# Patient Record
Sex: Female | Born: 2005 | Race: Black or African American | Hispanic: No | Marital: Single | Smoking: Never smoker
Health system: Southern US, Community
[De-identification: ages and names within clinical notes are randomized; demographics above are authoritative.]

## PROBLEM LIST (undated history)

## (undated) DIAGNOSIS — J45909 Unspecified asthma, uncomplicated: Secondary | ICD-10-CM

## (undated) DIAGNOSIS — L309 Dermatitis, unspecified: Secondary | ICD-10-CM

## (undated) HISTORY — DX: Dermatitis, unspecified: L30.9

## (undated) HISTORY — PX: NO PAST SURGERIES: SHX2092

## (undated) HISTORY — DX: Unspecified asthma, uncomplicated: J45.909

---
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
## 2023-03-10 ENCOUNTER — Ambulatory Visit
Admission: EM | Admit: 2023-03-10 | Discharge: 2023-03-10 | Disposition: A | Discharge status: Home / Self Care | Payer: Medicaid Other

## 2023-03-10 ENCOUNTER — Other Ambulatory Visit: Payer: Self-pay

## 2023-03-10 DIAGNOSIS — L0291 Cutaneous abscess, unspecified: Secondary | ICD-10-CM | POA: Diagnosis not present

## 2023-03-10 MED ORDER — DOXYCYCLINE HYCLATE 100 MG PO CAPS: 100.0000 mg | ORAL_CAPSULE | Freq: Two times a day (BID) | ORAL | 0 refills | Status: DC

## 2023-03-10 NOTE — ED Provider Notes (Signed)
[facility]    CSN: [id number] Arrival date & time: 03/10/23  1455      History   Chief Complaint Chief Complaint  Patient presents with   Insect Bite    HPI Latoya Stevens is a 17 y.o. female.   Patient here today for evaluation of possible insect bite to her left lower leg.  She states 2 days ago she started noticed a small area of redness and swelling but this is worsened with time.  She has not any fever.  She denies any nausea or vomiting.  She has not any shortness of breath or trouble swallowing.  The history is provided by the patient.    Past Medical History:  Diagnosis Date   Asthma     Patient Active Problem List   Diagnosis Date Noted   Chlamydia contact, treated 08/31/2021   Gonorrhea affecting pregnancy 08/31/2021   Stye 07/27/2021   Eczema 06/01/2021   Alpha thalassemia silent carrier 05/13/2021   Rh negative state in antepartum period 05/02/2021   Asthma 04/21/2021   Supervision of normal first teen pregnancy 04/14/2021    Past Surgical History:  Procedure Laterality Date   NO PAST SURGERIES      OB History     Gravida  1   Para  1   Term      Preterm  1   AB      Living  1      SAB      IAB      Ectopic      Multiple  0   Live Births  1            Home Medications    Prior to Admission medications   Medication Sig Start Date End Date Taking? Authorizing Provider  doxycycline (VIBRAMYCIN) 100 MG capsule Take 1 capsule (100 mg total) by mouth 2 (two) times daily. 03/10/23  Yes Tomi Bamberger, PA-C  GENABIO COVID-19 RAPID TEST KIT TEST AS DIRECTED TODAY 02/20/23  Yes [provider]  triamcinolone ointment (KENALOG) 0.1 % Apply 1 Application topically 2 (two) times daily. 02/20/23  Yes [provider]  acetaminophen (TYLENOL) 325 MG tablet Take 2 tablets (650 mg total) by mouth every 4 (four) hours as needed (for pain scale < 4). 10/04/21   Warner Mccreedy, MD  clindamycin (CLEOCIN) 100 MG  vaginal suppository Place 1 suppository (100 mg total) vaginally at bedtime. 12/28/21   Adam Phenix, MD  Prenatal Vit-Fe Fumarate-FA (MULTIVITAMIN-PRENATAL) 27-0.8 MG TABS tablet Take 1 tablet by mouth daily at 12 noon.    [provider]  VENTOLIN HFA 108 (90 Base) MCG/ACT inhaler INHALE 2 PUFFS INTO THE LUNGS EVERY 4 HOURS AS NEEDED FOR WHEEZING OR SHORTNESS OF BREATH 08/25/22   Gerrit Heck, CNM    Family History Family History  Problem Relation Age of Onset   Hypertension Mother     Social History Social History   Tobacco Use   Smoking status: Never    Passive exposure: Never   Smokeless tobacco: Never  Vaping Use   Vaping status: Never Used  Substance Use Topics   Alcohol use: No   Drug use: Not Currently    Types: Marijuana     Allergies   Egg-derived products, Fish allergy, Peanuts [peanut oil], and Tomato   Review of Systems Review of Systems  Constitutional:  Negative for chills and fever.  Eyes:  Negative for discharge and redness.  Respiratory:  Negative for shortness  of breath.   Gastrointestinal:  Negative for diarrhea, nausea and vomiting.  Skin:  Positive for color change. Negative for wound.     Physical Exam Triage Vital Signs ED Triage Vitals  Encounter Vitals Group     BP 03/10/23 1509 105/65     Systolic BP Percentile --      Diastolic BP Percentile --      Pulse Rate 03/10/23 1509 92     Resp 03/10/23 1509 16     Temp 03/10/23 1509 98.8 F (37.1 C)     Temp Source 03/10/23 1509 Oral     SpO2 03/10/23 1509 98 %     Weight 03/10/23 1508 100 lb (45.4 kg)     Height 03/10/23 1508 4\' 11"  (1.499 m)     Head Circumference --      Peak Flow --      Pain Score 03/10/23 1506 5     Pain Loc --      Pain Education --      Exclude from Growth Chart --    No data found.  Updated Vital Signs BP 105/65 (BP Location: Right Arm)   Pulse 92   Temp 98.8 F (37.1 C) (Oral)   Resp 16   Ht 4\' 11"  (1.499 m)   Wt 100 lb (45.4 kg)   LMP  02/01/2023 (Exact Date)   SpO2 98%   BMI 20.20 kg/m   Physical Exam Vitals and nursing note reviewed.  Constitutional:      General: She is not in acute distress.    Appearance: Normal appearance. She is not ill-appearing.  HENT:     Head: Normocephalic and atraumatic.     Nose: Nose normal. No congestion or rhinorrhea.  Eyes:     Conjunctiva/sclera: Conjunctivae normal.  Cardiovascular:     Rate and Rhythm: Normal rate.  Pulmonary:     Effort: Pulmonary effort is normal. No respiratory distress.  Skin:    Comments: Approximately 2 cm area of induration with erythema noted to left lower leg without active bleeding or drainage  Neurological:     Mental Status: She is alert.  Psychiatric:        Mood and Affect: Mood normal.        Behavior: Behavior normal.      UC Treatments / Results  Labs (all labs ordered are listed, but only abnormal results are displayed) Labs Reviewed - No data to display  EKG   Radiology No results found.  Procedures Procedures (including critical care time)  Medications Ordered in UC Medications - No data to display  Initial Impression / Assessment and Plan / UC Course  I have reviewed the triage vital signs and the nursing notes.  Pertinent labs & imaging results that were available during my care of the patient were reviewed by me and considered in my medical decision making (see chart for details).    Will treat to cover abscess with doxycycline.  Recommended follow-up if no gradual improvement with any further concerns.  Final Clinical Impressions(s) / UC Diagnoses   Final diagnoses:  Abscess   Discharge Instructions   None    ED Prescriptions     Medication Sig Dispense Auth. Provider   doxycycline (VIBRAMYCIN) 100 MG capsule Take 1 capsule (100 mg total) by mouth 2 (two) times daily. 20 capsule Tomi Bamberger, PA-C      PDMP not reviewed this encounter.   Tomi Bamberger, PA-C 03/10/23 1535

## 2023-03-10 NOTE — ED Triage Notes (Signed)
"  I have a bite (maybe spider) on my left lower leg, outside of it". Redness, swelling. No fever. First noticed "about 2 days ago".

## 2023-05-02 ENCOUNTER — Ambulatory Visit (HOSPITAL_BASED_OUTPATIENT_CLINIC_OR_DEPARTMENT_OTHER): Payer: Medicaid Other | Admitting: Family Medicine

## 2023-05-24 ENCOUNTER — Encounter (HOSPITAL_BASED_OUTPATIENT_CLINIC_OR_DEPARTMENT_OTHER): Payer: Self-pay | Admitting: Family Medicine

## 2023-05-24 ENCOUNTER — Ambulatory Visit (INDEPENDENT_AMBULATORY_CARE_PROVIDER_SITE_OTHER): Payer: Medicaid Other | Admitting: Family Medicine

## 2023-05-24 VITALS — BP 92/61 | HR 85 | Ht 59.0 in | Wt 103.0 lb

## 2023-05-24 DIAGNOSIS — Z7689 Persons encountering health services in other specified circumstances: Secondary | ICD-10-CM | POA: Diagnosis not present

## 2023-05-24 DIAGNOSIS — J4530 Mild persistent asthma, uncomplicated: Secondary | ICD-10-CM | POA: Diagnosis not present

## 2023-05-24 DIAGNOSIS — Z202 Contact with and (suspected) exposure to infections with a predominantly sexual mode of transmission: Secondary | ICD-10-CM | POA: Diagnosis not present

## 2023-05-24 DIAGNOSIS — L308 Other specified dermatitis: Secondary | ICD-10-CM

## 2023-05-24 LAB — OB RESULTS CONSOLE GC/CHLAMYDIA: Chlamydia: POSITIVE

## 2023-05-24 MED ORDER — VENTOLIN HFA 108 (90 BASE) MCG/ACT IN AERS: 2.0000 | INHALATION_SPRAY | RESPIRATORY_TRACT | 6 refills | Status: DC | PRN

## 2023-05-24 MED ORDER — MONTELUKAST SODIUM 10 MG PO TABS: 10.0000 mg | ORAL_TABLET | Freq: Every day | ORAL | 3 refills | Status: DC

## 2023-05-24 MED ORDER — TRIAMCINOLONE ACETONIDE 0.1 % EX OINT: 1.0000 | TOPICAL_OINTMENT | Freq: Two times a day (BID) | CUTANEOUS | 5 refills | Status: DC

## 2023-05-24 NOTE — Progress Notes (Signed)
New Patient Office Visit  Subjective:   Latoya Stevens 08-Jan-2006 05/24/2023  Chief Complaint  Patient presents with   New Patient (Initial Visit)    Patient is here to get established with the practice. Has concerns about eczema, asthma, and sinus problems. Has been having problems with her sinuses for the past 2 years.     HPI: Latoya Stevens presents today to establish care at Primary Care and Sports Medicine at [facility]. Introduced to Publishing rights manager role and practice setting.  All questions answered.   Last PCP: OBGYN for management of chronic conditions  Last annual physical: Unknown Concerns: See below   ECZEMA:  Patient reports hx of eczema since childhood that worsens in warm and dry weather. She does use kenalog cream 0.1% and had been on anti-itch "oral medication in the past for relief. She states kenalog cream does keep flaking and flare ups controlled.   ASTHMA: [name] presents for the medical management of asthma.  Medication regimen: Albuterol Inhaler as needed Well controlled: Not currently   Denies worsening SHOB, cough, wheezing, fevers or chills  The patient has been previously diagnosed with asthma. Symptoms have previously included chest tightness, dyspnea, productive cough, and wheezing. Associated symptoms include headache  , nasal congestion, productive cough, rhinorrhea  , and sneezing.  Patient has attempted to use antihistamine such as Zyrtec or Claritin without relief suspected precipitants include .  Symptoms have been gradually worsening since their onset. Observed precipitants include cold air, exercise, pollens, and upper respiratory infection.  Current limitations in activity from asthma include  limited length of exercise .  Number of days of school or work missed in the last month: 3. The previous exacerbation occurred  last week    ago. The patient reports adherence to this regimen  STD Testing:  Patient requesting  STD testing for gonorrhea and chlamydia only today.  Reports unprotected recent sexual activity.  Patient currently is using Nexplanon for contraceptive.  She is asymptomatic and denies discharge, dysuria, lesions or rashes, or abdominal pain.  Denies known exposure to STD.    Patient's last menstrual period was 04/22/2023 (approximate).   The following portions of the patient's history were reviewed and updated as appropriate: past medical history, past surgical history, family history, social history, allergies, medications, and problem list.   Patient Active Problem List   Diagnosis Date Noted   Chlamydia contact, treated 08/31/2021   Gonorrhea affecting pregnancy 08/31/2021   Stye 07/27/2021   Eczema 06/01/2021   Alpha thalassemia silent carrier 05/13/2021   Rh negative state in antepartum period 05/02/2021   Asthma 04/21/2021   Supervision of normal first teen pregnancy 04/14/2021   Past Medical History:  Diagnosis Date   Asthma    Eczema    Past Surgical History:  Procedure Laterality Date   NO PAST SURGERIES     Family History  Problem Relation Age of Onset   Hypertension Mother    Social History   Socioeconomic History   Marital status: Single    Spouse name: Not on file   Number of children: Not on file   Years of education: Not on file   Highest education level: Not on file  Occupational History   Not on file  Tobacco Use   Smoking status: Never    Passive exposure: Never   Smokeless tobacco: Never  Vaping Use   Vaping status: Never Used  Substance and Sexual Activity   Alcohol use: No   Drug  use: Not Currently    Types: Marijuana   Sexual activity: Yes    Partners: Male    Birth control/protection: Implant    Comment: Nexplanon April 2023  Other Topics Concern   Not on file  Social History Narrative   Not on file   Social Drivers of Health   Financial Resource Strain: Low Risk  (05/24/2023)   Overall Financial Resource Strain (CARDIA)     Difficulty of Paying Living Expenses: Not hard at all  Food Insecurity: No Food Insecurity (05/24/2023)   Hunger Vital Sign    Worried About Running Out of Food in the Last Year: Never true    Ran Out of Food in the Last Year: Never true  Transportation Needs: No Transportation Needs (05/24/2023)   PRAPARE - Administrator, Civil Service (Medical): No    Lack of Transportation (Non-Medical): No  Physical Activity: Insufficiently Active (05/24/2023)   Exercise Vital Sign    Days of Exercise per Week: 3 days    Minutes of Exercise per Session: 20 min  Stress: No Stress Concern Present (05/24/2023)   [employer] of Occupational Health - Occupational Stress Questionnaire    Feeling of Stress : Not at all  Social Connections: Moderately Isolated (05/24/2023)   Social Connection and Isolation Panel [NHANES]    Frequency of Communication with Friends and Family: More than three times a week    Frequency of Social Gatherings with Friends and Family: Twice a week    Attends Religious Services: More than 4 times per year    Active Member of [employer] or Organizations: No    Attends Banker Meetings: Never    Marital Status: Never married  Intimate Partner Violence: Not At Risk (05/24/2023)   Humiliation, Afraid, Rape, and Kick questionnaire    Fear of Current or Ex-Partner: No    Emotionally Abused: No    Physically Abused: No    Sexually Abused: No   Outpatient Medications Prior to Visit  Medication Sig Dispense Refill   Etonogestrel (NEXPLANON [place]) Inject into the skin.     acetaminophen (TYLENOL) 325 MG tablet Take 2 tablets (650 mg total) by mouth every 4 (four) hours as needed (for pain scale < 4). 60 tablet 0   VENTOLIN HFA 108 (90 Base) MCG/ACT inhaler INHALE 2 PUFFS INTO THE LUNGS EVERY 4 HOURS AS NEEDED FOR WHEEZING OR SHORTNESS OF BREATH 18 g 2   clindamycin (CLEOCIN) 100 MG vaginal suppository Place 1 suppository (100 mg total) vaginally at bedtime. 3  suppository 0   doxycycline (VIBRAMYCIN) 100 MG capsule Take 1 capsule (100 mg total) by mouth 2 (two) times daily. 20 capsule 0   GENABIO COVID-19 RAPID TEST KIT TEST AS DIRECTED TODAY     Prenatal Vit-Fe Fumarate-FA (MULTIVITAMIN-PRENATAL) 27-0.8 MG TABS tablet Take 1 tablet by mouth daily at 12 noon.     triamcinolone ointment (KENALOG) 0.1 % Apply 1 Application topically 2 (two) times daily. (Patient not taking: Reported on 05/24/2023)     No facility-administered medications prior to visit.   Allergies  Allergen Reactions   Egg-Derived Products     Pt states she is not allergice to eggs or egg derived products   Fish Allergy    Peanuts [Peanut Oil]     ROS: A complete ROS was performed with pertinent positives/negatives noted in the HPI. The remainder of the ROS are negative.   Objective:   Today's Vitals   05/24/23 1545  BP: 92/61  Pulse: 85  SpO2: 100%  Weight: 103 lb (46.7 kg)  Height: 4\' 11"  (1.499 m)    GENERAL: Well-appearing, in NAD. Well nourished.  SKIN: Pink, warm and dry.  Macular, erythematous like rash with flaking present to bilateral upper extremities.  No drainage, bleeding or plaque-like formation Head: Normocephalic. NECK: Trachea midline. Full ROM w/o pain or tenderness. No lymphadenopathy.  EARS: Tympanic membranes are intact, translucent without bulging and without drainage. Appropriate landmarks visualized.  EYES: Conjunctiva clear without exudates. EOMI, PERRL, no drainage present.  NOSE: Septum midline w/o deformity. Nares patent, mucosa pink and non-inflamed w/o drainage. No sinus tenderness.  THROAT: Uvula midline. Oropharynx clear.  Mucous membranes pink and moist.  RESPIRATORY: Chest wall symmetrical. Respirations even and non-labored. Breath sounds clear to auscultation bilaterally.  Cough is congested, nonproductive CARDIAC: S1, S2 present, regular rate and rhythm without murmur or gallops. Peripheral pulses 2+ bilaterally.  MSK: Muscle tone  and strength appropriate for age.  EXTREMITIES: Without clubbing, cyanosis, or edema.  NEUROLOGIC: No motor or sensory deficits. Steady, even gait. C2-C12 intact.  PSYCH/MENTAL STATUS: Alert, oriented x 3. Cooperative, appropriate mood and affect.      Assessment & Plan:  1. Encounter to establish care with new doctor (Primary) Discussed role of PCP with patient and need for well-child check.  Patient will return in the next 4 to 6 weeks to complete.  2. Encounter for assessment of sexually transmitted disease exposure Patient is asymptomatic.  Offered screening for STI including RPR, trichomonas, hepatitis C, HIV and patient declined.  Urine obtained and sent to [facility] for testing of gonorrhea and chlamydia.  Patient will be notified of results when available. - Ct, Ng, Mycoplasmas NAA, Urine  3. Mild persistent asthma without complication Patient to continue albuterol inhaler as needed.  Patient to start Singulair 10 mg nightly at bedtime for mild persistent asthma.  Discussed avoidance of triggers and management.  Will follow-up in approximately 6 weeks for asthma evaluation. - montelukast (SINGULAIR) 10 MG tablet; Take 1 tablet (10 mg total) by mouth at bedtime.  Dispense: 30 tablet; Refill: 3  4. Other eczema Kenalog cream working well for her and refilled.  Also recommended OTC topical therapies to assist with dryness and use of Vaseline or Aquaphor as well.   Patient to reach out to office if new, worrisome, or unresolved symptoms arise or if no improvement in patient's condition. Patient verbalized understanding and is agreeable to treatment plan. All questions answered to patient's satisfaction.    Return in about 2 months (around 07/25/2023) for [facility] and Asthma Follow up .    Hilbert Bible, [place]

## 2023-05-27 ENCOUNTER — Encounter (HOSPITAL_BASED_OUTPATIENT_CLINIC_OR_DEPARTMENT_OTHER): Payer: Self-pay | Admitting: Family Medicine

## 2023-05-27 LAB — CT, NG, MYCOPLASMAS NAA, URINE
Chlamydia trachomatis, NAA: POSITIVE — AB
Mycoplasma genitalium NAA: POSITIVE — AB
Mycoplasma hominis NAA: POSITIVE — AB
Neisseria gonorrhoeae, NAA: NEGATIVE
Ureaplasma spp NAA: POSITIVE — AB

## 2023-05-28 ENCOUNTER — Encounter (HOSPITAL_BASED_OUTPATIENT_CLINIC_OR_DEPARTMENT_OTHER): Payer: Self-pay | Admitting: Family Medicine

## 2023-05-28 MED ORDER — DOXYCYCLINE HYCLATE 100 MG PO TABS: 100.0000 mg | ORAL_TABLET | Freq: Two times a day (BID) | ORAL | 0 refills | Status: DC

## 2023-05-28 MED ORDER — MOXIFLOXACIN HCL 400 MG PO TABS: 400.0000 mg | ORAL_TABLET | Freq: Every day | ORAL | 0 refills | Status: AC

## 2023-05-28 NOTE — Addendum Note (Signed)
Addended byJerre Simon on: 05/28/2023 01:25 PM   Modules accepted: Orders

## 2023-07-25 ENCOUNTER — Ambulatory Visit (HOSPITAL_BASED_OUTPATIENT_CLINIC_OR_DEPARTMENT_OTHER): Payer: Medicaid Other | Admitting: Family Medicine

## 2023-07-25 ENCOUNTER — Encounter (HOSPITAL_BASED_OUTPATIENT_CLINIC_OR_DEPARTMENT_OTHER): Payer: Self-pay | Admitting: Family Medicine

## 2023-07-25 ENCOUNTER — Other Ambulatory Visit (HOSPITAL_COMMUNITY)
Admission: RE | Admit: 2023-07-25 | Discharge: 2023-07-25 | Disposition: A | Discharge status: Home / Self Care | Payer: Medicaid Other | Source: Ambulatory Visit | Attending: Family Medicine | Admitting: Family Medicine

## 2023-07-25 VITALS — BP 109/65 | HR 83 | Ht 59.0 in | Wt 102.8 lb

## 2023-07-25 DIAGNOSIS — Z0000 Encounter for general adult medical examination without abnormal findings: Secondary | ICD-10-CM | POA: Diagnosis not present

## 2023-07-25 DIAGNOSIS — Z13220 Encounter for screening for lipoid disorders: Secondary | ICD-10-CM

## 2023-07-25 DIAGNOSIS — J4530 Mild persistent asthma, uncomplicated: Secondary | ICD-10-CM | POA: Diagnosis not present

## 2023-07-25 DIAGNOSIS — Z1159 Encounter for screening for other viral diseases: Secondary | ICD-10-CM

## 2023-07-25 DIAGNOSIS — Z114 Encounter for screening for human immunodeficiency virus [HIV]: Secondary | ICD-10-CM

## 2023-07-25 DIAGNOSIS — Z23 Encounter for immunization: Secondary | ICD-10-CM

## 2023-07-25 DIAGNOSIS — Z202 Contact with and (suspected) exposure to infections with a predominantly sexual mode of transmission: Secondary | ICD-10-CM | POA: Diagnosis present

## 2023-07-25 DIAGNOSIS — A749 Chlamydial infection, unspecified: Secondary | ICD-10-CM | POA: Diagnosis not present

## 2023-07-25 MED ORDER — MONTELUKAST SODIUM 10 MG PO TABS: 10.0000 mg | ORAL_TABLET | Freq: Every day | ORAL | 3 refills | Status: AC

## 2023-07-25 NOTE — Patient Instructions (Signed)
Marland Kitchen

## 2023-07-25 NOTE — Progress Notes (Signed)
Subjective:   Latoya Stevens [birth year]  07/25/2023   CC: Chief Complaint  Patient presents with   Well Child    Patient is here today for her well child checkup. Patient is wanting to do a urinalysis for STD check.    HPI: Latoya Stevens is a 18 y.o. female who presents for a routine health maintenance exam.  Labs collected at time of visit.   ASTHMA:  ASTHMA: Latoya Stevens presents for the follow up of medical management of asthma with new medication changes.  Medication regimen: Albuterol inhaler PRN, Singulair 10mg  nightly (new started in Dec 2024 Well controlled: Yes currently with added Singulair   Denies worsening SHOB, cough, wheezing  HEALTH SCREENINGS: - Vision Screening: not applicable - Dental Visits: up to date - Pap smear: not applicable - Breast Exam: not applicable - STD Screening: Ordered today- asymptomatic  - Mammogram (40+): Not applicable  - Colonoscopy (45+): Not applicable  - Bone Density (65+ or under 65 with predisposing conditions): Not applicable  - Lung CA screening with low-dose CT:  Not applicable Adults age 68-80 who are current cigarette smokers or quit within the last 15 years. Must have 20 pack year history.   Depression and Anxiety Screen done today and results listed below:     07/25/2023    3:28 PM 05/24/2023    3:53 PM 08/18/2021   10:37 AM 04/14/2021    2:05 PM  Depression screen PHQ 2/9  Decreased Interest 0 0 2 0  Down, Depressed, Hopeless 0 0 0 0  PHQ - 2 Score 0 0 2 0  Altered sleeping 0 0 2 0  Tired, decreased energy 0 0 2 1  Change in appetite 0 0 0 0  Feeling bad or failure about yourself  0 0 0 0  Trouble concentrating 0 0 0 0  Moving slowly or fidgety/restless 0 0 0 0  Suicidal thoughts 0 0 0 0  PHQ-9 Score 0 0 6 1  Difficult doing work/chores Not difficult at all Not difficult at all        07/25/2023    3:28 PM 05/24/2023    3:53 PM 08/18/2021   10:37 AM 04/14/2021    2:06 PM  GAD 7 : Generalized Anxiety Score   Nervous, Anxious, on Edge 0 0 0 0  Control/stop worrying 0 0 0 0  Worry too much - different things 0 0 0 0  Trouble relaxing 0 0 0 0  Restless 0 0 0 0  Easily annoyed or irritable 0 0 2 0  Afraid - awful might happen 0 0 0 0  Total GAD 7 Score 0 0 2 0  Anxiety Difficulty Not difficult at all Not difficult at all      IMMUNIZATIONS: - Tdap: Tetanus vaccination status reviewed: last tetanus booster within 10 years. - HPV:  Will receive 2nd vaccine today - Influenza: Refused - Pneumovax: Not applicable - Prevnar 20: Not applicable - Shingrix (50+): Not applicable   Past medical history, surgical history, medications, allergies, family history and social history reviewed with patient today and changes made to appropriate areas of the chart.   Past Medical History:  Diagnosis Date   Asthma    Chlamydia contact, treated 08/31/2021   Eczema    Gonorrhea affecting pregnancy 08/31/2021   Rh negative state in antepartum period 05/02/2021   Rhogam at 28-30 weeks     Stye 07/27/2021   Supervision of normal first teen pregnancy 04/14/2021  Nursing Staff    Provider      Office Location    [facility]    Dating     6 wk [place]      Language     English    Anatomy US     Normal with echogenic focus- f/u scheduled      Flu Vaccine     04/21/21    Genetic/Carrier Screen     NIPS:  LR  AFP:     [place]: silent carrier alpha thal, neg 3/4      TDaP Vaccine      declined 08/18/2021    Hgb A1C or   GTT    Early N/A  Third trimester 60,     Past Surgical History:  Procedure Laterality Date   NO PAST SURGERIES      Current Outpatient Medications on File Prior to Visit  Medication Sig   Etonogestrel (NEXPLANON [place]) Inject into the skin.   triamcinolone ointment (KENALOG) 0.1 % Apply 1 Application topically 2 (two) times daily.   VENTOLIN HFA 108 (90 Base) MCG/ACT inhaler Inhale 2 puffs into the lungs every 4 (four) hours as needed for wheezing or shortness of breath.   No current  facility-administered medications on file prior to visit.    Allergies  Allergen Reactions   Fish Allergy    Peanuts [Peanut Oil]      Social History   Socioeconomic History   Marital status: Single    Spouse name: Not on file   Number of children: Not on file   Years of education: Not on file   Highest education level: Not on file  Occupational History   Not on file  Tobacco Use   Smoking status: Never    Passive exposure: Never   Smokeless tobacco: Never  Vaping Use   Vaping status: Never Used  Substance and Sexual Activity   Alcohol use: No   Drug use: Not Currently    Types: Marijuana   Sexual activity: Yes    Partners: Male    Birth control/protection: Implant    Comment: Nexplanon April 2023  Other Topics Concern   Not on file  Social History Narrative   Not on file   Social Drivers of Health   Financial Resource Strain: Low Risk  (05/24/2023)   Overall Financial Resource Strain (CARDIA)    Difficulty of Paying Living Expenses: Not hard at all  Food Insecurity: No Food Insecurity (05/24/2023)   Hunger Vital Sign    Worried About Running Out of Food in the Last Year: Never true    Ran Out of Food in the Last Year: Never true  Transportation Needs: No Transportation Needs (05/24/2023)   PRAPARE - Administrator, Civil Service (Medical): No    Lack of Transportation (Non-Medical): No  Physical Activity: Insufficiently Active (05/24/2023)   Exercise Vital Sign    Days of Exercise per Week: 3 days    Minutes of Exercise per Session: 20 min  Stress: No Stress Concern Present (05/24/2023)   [employer] of Occupational Health - Occupational Stress Questionnaire    Feeling of Stress : Not at all  Social Connections: Moderately Isolated (05/24/2023)   Social Connection and Isolation Panel [NHANES]    Frequency of Communication with Friends and Family: More than three times a week    Frequency of Social Gatherings with Friends and Family:  Twice a week    Attends Religious Services: More than 4 times per year  Active Member of Clubs or Organizations: No    Attends Banker Meetings: Never    Marital Status: Never married  Intimate Partner Violence: Not At Risk (05/24/2023)   Humiliation, Afraid, Rape, and Kick questionnaire    Fear of Current or Ex-Partner: No    Emotionally Abused: No    Physically Abused: No    Sexually Abused: No   Social History   Tobacco Use  Smoking Status Never   Passive exposure: Never  Smokeless Tobacco Never   Social History   Substance and Sexual Activity  Alcohol Use No    Family History  Problem Relation Age of Onset   Hypertension Mother      ROS: Denies fever, fatigue, unexplained weight loss/gain, chest pain, SHOB, and palpitations. Denies neurological deficits, gastrointestinal or genitourinary complaints, and skin changes.   Objective:   Today's Vitals   07/25/23 1524  BP: 109/65  Pulse: 83  SpO2: 100%  Weight: 102 lb 12.8 oz (46.6 kg)  Height: 4\' 11"  (1.499 m)    GENERAL APPEARANCE: Well-appearing, in NAD. Well nourished.  SKIN: Pink, warm and dry. Turgor normal. No rash, lesion, ulceration, or ecchymoses. Hair evenly distributed.  HEENT: HEAD: Normocephalic.  EYES: PERRLA. EOMI. Lids intact w/o defect. Sclera white, Conjunctiva pink w/o exudate.  EARS: External ear w/o redness, swelling, masses or lesions. EAC clear. TM's intact, translucent w/o bulging, appropriate landmarks visualized. Appropriate acuity to conversational tones.  NOSE: Septum midline w/o deformity. Nares patent, mucosa pink and non-inflamed w/o drainage. No sinus tenderness.  THROAT: Uvula midline. Oropharynx clear. Tonsils non-inflamed w/o exudate. Oral mucosa pink and moist.  NECK: Supple, Trachea midline. Full ROM w/o pain or tenderness. No lymphadenopathy. Thyroid non-tender w/o enlargement or palpable masses.  RESPIRATORY: Chest wall symmetrical w/o masses. Respirations even  and non-labored. Breath sounds clear to auscultation bilaterally. No wheezes, rales, rhonchi, or crackles. CARDIAC: S1, S2 present, regular rate and rhythm. No gallops, murmurs, rubs, or clicks. PMI w/o lifts, heaves, or thrills. No carotid bruits. Capillary refill <2 seconds. Peripheral pulses 2+ bilaterally. GI: Abdomen soft w/o distention. Normoactive bowel sounds. No palpable masses or tenderness. No guarding or rebound tenderness. Liver and spleen w/o tenderness or enlargement. No CVA tenderness.  MSK: Muscle tone and strength appropriate for age, w/o atrophy or abnormal movement.  EXTREMITIES: Active ROM intact, w/o tenderness, crepitus, or contracture. No obvious joint deformities or effusions. No clubbing, edema, or cyanosis.  NEUROLOGIC: CN's II-XII intact. Motor strength symmetrical with no obvious weakness. No sensory deficits. DTR's 2+ symmetric bilaterally. Steady, even gait.  PSYCH/MENTAL STATUS: Alert, oriented x 3. Cooperative, appropriate mood and affect.    Assessment & Plan:  1. Annual physical exam (Primary) Discussed healthy lifestyle, nutrition and exercise and discussed preventative screenings and vaccinations with patient.  She declines meningitis B vaccine today, but would like to obtain her second HPV vaccine and complete this series of this year. - CBC with Differential/Platelet - Comprehensive metabolic panel - Lipid panel  2. Mild persistent asthma without complication Asthma improved per patient with addition of Singulair.  Will send in refill of Singulair and patient to continue to use albuterol as needed.  Will follow-up as needed if symptoms are worsening or progression of asthma. - montelukast (SINGULAIR) 10 MG tablet; Take 1 tablet (10 mg total) by mouth at bedtime.  Dispense: 90 tablet; Refill: 3  3. Chlamydia infection Patient treated with appropriate antibiotic course in December 2024.  Will repeat testing today for clearance.  4. Immunization  due - HPV  9-valent vaccine,Recombinat  5. Screening for lipid disorders Will obtain lipid profile as part of annual exam to screen for lipid disorders. - Lipid panel  6. Encounter for assessment of sexually transmitted disease exposure Patient currently asymptomatic and requesting testing.  Will obtain urine cytology to rule out gonorrhea, recurrence of chlamydia and obtain blood work for RPR. - RPR - Urine cytology ancillary only  7. Encounter for hepatitis C screening test for low risk patient Asymptomatic.  Patient requested testing for hepatitis C will obtain with blood work. - Hepatitis C antibody  8. Encounter for screening for HIV Asymptomatic.  Patient requested testing for HIV and will obtain the blood work - HIV Antibody (routine testing w rflx)   Orders Placed This Encounter  Procedures   HPV 9-valent vaccine,Recombinat   CBC with Differential/Platelet   Comprehensive metabolic panel   Lipid panel   Hepatitis C antibody   HIV Antibody (routine testing w rflx)   RPR    PATIENT COUNSELING:  - Encouraged a healthy well-balanced diet. Patient may adjust caloric intake to maintain or achieve ideal body weight. May reduce intake of dietary saturated fat and total fat and have adequate dietary potassium and calcium preferably from fresh fruits, vegetables, and low-fat dairy products.   - Advised to avoid cigarette smoking. - Discussed with the patient that most people either abstain from alcohol or drink within safe limits (<=14/week and <=4 drinks/occasion for males, <=7/weeks and <= 3 drinks/occasion for females) and that the risk for alcohol disorders and other health effects rises proportionally with the number of drinks per week and how often a drinker exceeds daily limits. - Discussed cessation/primary prevention of drug use and availability of treatment for abuse.  - Discussed sexually transmitted diseases, avoidance of unintended pregnancy and contraceptive alternatives.  -  Stressed the importance of regular exercise - Injury prevention: Discussed safety belts, safety helmets, smoke detector, smoking near bedding or upholstery.  - Dental health: Discussed importance of regular tooth brushing, flossing, and dental visits.   NEXT PREVENTATIVE PHYSICAL DUE IN 1 YEAR.  Return for 6 months- Nurse Visit Vaccine ; 1 Year AE.  Patient to reach out to office if new, worrisome, or unresolved symptoms arise or if no improvement in patient's condition. Patient verbalized understanding and is agreeable to treatment plan. All questions answered to patient's satisfaction.    Hilbert Bible, [place]

## 2023-07-26 ENCOUNTER — Encounter (HOSPITAL_BASED_OUTPATIENT_CLINIC_OR_DEPARTMENT_OTHER): Payer: Self-pay | Admitting: Family Medicine

## 2023-07-26 LAB — URINE CYTOLOGY ANCILLARY ONLY
Chlamydia: POSITIVE — AB
Comment: NEGATIVE
Comment: NEGATIVE
Comment: NORMAL
Neisseria Gonorrhea: NEGATIVE
Trichomonas: NEGATIVE

## 2023-07-26 LAB — COMPREHENSIVE METABOLIC PANEL
ALT: 12 [IU]/L (ref 0–32)
AST: 28 [IU]/L (ref 0–40)
Albumin: 4.5 g/dL (ref 4.0–5.0)
Alkaline Phosphatase: 86 [IU]/L (ref 42–106)
BUN/Creatinine Ratio: 17 (ref 9–23)
BUN: 14 mg/dL (ref 6–20)
Bilirubin Total: 0.3 mg/dL (ref 0.0–1.2)
CO2: 20 mmol/L (ref 20–29)
Calcium: 9.2 mg/dL (ref 8.7–10.2)
Chloride: 103 mmol/L (ref 96–106)
Creatinine, Ser: 0.83 mg/dL (ref 0.57–1.00)
Globulin, Total: 3 g/dL (ref 1.5–4.5)
Glucose: 77 mg/dL (ref 70–99)
Potassium: 4.2 mmol/L (ref 3.5–5.2)
Sodium: 139 mmol/L (ref 134–144)
Total Protein: 7.5 g/dL (ref 6.0–8.5)
eGFR: 104 mL/min/{1.73_m2} (ref >59)

## 2023-07-26 LAB — CBC WITH DIFFERENTIAL/PLATELET
Basophils Absolute: 0 10*3/uL (ref 0.0–0.2)
Basos: 0 %
EOS (ABSOLUTE): 0.4 10*3/uL (ref 0.0–0.4)
Eos: 8 %
Hematocrit: 38.3 % (ref 34.0–46.6)
Hemoglobin: 12.1 g/dL (ref 11.1–15.9)
Immature Grans (Abs): 0 10*3/uL (ref 0.0–0.1)
Immature Granulocytes: 0 %
Lymphocytes Absolute: 1.6 10*3/uL (ref 0.7–3.1)
Lymphs: 36 %
MCH: 29.4 pg (ref 26.6–33.0)
MCHC: 31.6 g/dL (ref 31.5–35.7)
MCV: 93 fL (ref 79–97)
Monocytes Absolute: 0.4 10*3/uL (ref 0.1–0.9)
Monocytes: 10 %
Neutrophils Absolute: 2 10*3/uL (ref 1.4–7.0)
Neutrophils: 46 %
Platelets: 201 10*3/uL (ref 150–450)
RBC: 4.12 x10E6/uL (ref 3.77–5.28)
RDW: 11.7 % (ref 11.7–15.4)
WBC: 4.5 10*3/uL (ref 3.4–10.8)

## 2023-07-26 LAB — HEPATITIS C ANTIBODY: Hep C Virus Ab: NONREACTIVE

## 2023-07-26 LAB — HIV ANTIBODY (ROUTINE TESTING W REFLEX): HIV Screen 4th Generation wRfx: NONREACTIVE

## 2023-07-26 LAB — LIPID PANEL
Chol/HDL Ratio: 2.3 {ratio} (ref 0.0–4.4)
Cholesterol, Total: 146 mg/dL (ref 100–169)
HDL: 63 mg/dL (ref >39)
LDL Chol Calc (NIH): 70 mg/dL (ref 0–109)
Triglycerides: 62 mg/dL (ref 0–89)
VLDL Cholesterol Cal: 13 mg/dL (ref 5–40)

## 2023-07-26 LAB — RPR: RPR Ser Ql: NONREACTIVE

## 2023-07-26 NOTE — Progress Notes (Signed)
HI Shandon,  Your labs look great. Your blood counts have improved and are at baseline. Your electrolytes, kidney and liver function is normal. Your cholesterol is excellent. Your HIV, syphilis and Hep C are negative.

## 2023-07-27 ENCOUNTER — Other Ambulatory Visit (HOSPITAL_BASED_OUTPATIENT_CLINIC_OR_DEPARTMENT_OTHER): Payer: Self-pay | Admitting: Family Medicine

## 2023-07-27 ENCOUNTER — Encounter (HOSPITAL_BASED_OUTPATIENT_CLINIC_OR_DEPARTMENT_OTHER): Payer: Self-pay | Admitting: Family Medicine

## 2023-07-27 DIAGNOSIS — A749 Chlamydial infection, unspecified: Secondary | ICD-10-CM | POA: Insufficient documentation

## 2023-07-27 MED ORDER — DOXYCYCLINE HYCLATE 100 MG PO TABS: 100.0000 mg | ORAL_TABLET | Freq: Two times a day (BID) | ORAL | 0 refills | Status: AC

## 2023-07-27 NOTE — Progress Notes (Signed)
Please schedule pt for urine only lab visit in 3 months for clearance of infection.   Hi Latoya Stevens, Your urine cytology resulted today and it was positive for chlamydia. I am sending in an antibiotic for you to take. Please take the full course. You should have any partners also tested and treated for STDs prior to resuming sexual activity. I would recommend we recheck your urine in 3 months for clearance.

## 2023-09-13 ENCOUNTER — Encounter (HOSPITAL_BASED_OUTPATIENT_CLINIC_OR_DEPARTMENT_OTHER): Payer: Medicaid Other | Admitting: Family Medicine

## 2023-09-27 ENCOUNTER — Other Ambulatory Visit (HOSPITAL_BASED_OUTPATIENT_CLINIC_OR_DEPARTMENT_OTHER): Payer: Self-pay | Admitting: Family Medicine

## 2023-10-04 ENCOUNTER — Ambulatory Visit

## 2023-10-11 ENCOUNTER — Other Ambulatory Visit (HOSPITAL_COMMUNITY)
Admission: RE | Admit: 2023-10-11 | Discharge: 2023-10-11 | Disposition: A | Discharge status: Home / Self Care | Source: Ambulatory Visit | Attending: Obstetrics and Gynecology | Admitting: Obstetrics and Gynecology

## 2023-10-11 ENCOUNTER — Ambulatory Visit

## 2023-10-11 VITALS — BP 114/70 | HR 78

## 2023-10-11 DIAGNOSIS — N760 Acute vaginitis: Secondary | ICD-10-CM | POA: Insufficient documentation

## 2023-10-11 DIAGNOSIS — A5602 Chlamydial vulvovaginitis: Secondary | ICD-10-CM | POA: Insufficient documentation

## 2023-10-11 DIAGNOSIS — Z113 Encounter for screening for infections with a predominantly sexual mode of transmission: Secondary | ICD-10-CM

## 2023-10-11 DIAGNOSIS — B9689 Other specified bacterial agents as the cause of diseases classified elsewhere: Secondary | ICD-10-CM | POA: Insufficient documentation

## 2023-10-11 NOTE — Progress Notes (Signed)
 SUBJECTIVE:  18 y.o. female who desires a STI screen. Denies abnormal vaginal discharge, bleeding or significant pelvic pain. No UTI symptoms. Denies history of known exposure to STD.  No LMP recorded.  OBJECTIVE:  She appears well.   ASSESSMENT:  STI Screen   PLAN:  Pt offered STI blood screening-not indicated GC, chlamydia, and trichomonas probe sent to lab.  Treatment: To be determined once lab results are received.  Pt follow up as needed.

## 2023-10-11 NOTE — Addendum Note (Signed)
 Addended by: Ina Manas on: 10/11/2023 04:47 PM   Modules accepted: Orders

## 2023-10-15 ENCOUNTER — Other Ambulatory Visit (HOSPITAL_BASED_OUTPATIENT_CLINIC_OR_DEPARTMENT_OTHER): Payer: Self-pay | Admitting: Family Medicine

## 2023-10-15 DIAGNOSIS — A749 Chlamydial infection, unspecified: Secondary | ICD-10-CM

## 2023-10-15 LAB — CERVICOVAGINAL ANCILLARY ONLY
Bacterial Vaginitis (gardnerella): POSITIVE — AB
Candida Glabrata: NEGATIVE
Candida Vaginitis: NEGATIVE
Chlamydia: POSITIVE — AB
Comment: NEGATIVE
Comment: NEGATIVE
Comment: NEGATIVE
Comment: NEGATIVE
Comment: NEGATIVE
Comment: NORMAL
Neisseria Gonorrhea: NEGATIVE
Trichomonas: NEGATIVE

## 2023-10-16 ENCOUNTER — Other Ambulatory Visit: Payer: Self-pay

## 2023-10-16 MED ORDER — DOXYCYCLINE HYCLATE 100 MG PO CAPS: 100.0000 mg | ORAL_CAPSULE | Freq: Two times a day (BID) | ORAL | 0 refills | Status: AC

## 2023-10-16 MED ORDER — METRONIDAZOLE 0.75 % VA GEL: 1.0000 | Freq: Every day | VAGINAL | 1 refills | Status: AC

## 2023-10-16 NOTE — Progress Notes (Signed)
 Doxycycline  rx sent for + chlamydia per protocol. Metrogel  sent for BV

## 2023-10-24 ENCOUNTER — Encounter (HOSPITAL_BASED_OUTPATIENT_CLINIC_OR_DEPARTMENT_OTHER): Admitting: Family Medicine

## 2023-10-29 ENCOUNTER — Ambulatory Visit (HOSPITAL_BASED_OUTPATIENT_CLINIC_OR_DEPARTMENT_OTHER): Payer: Medicaid Other

## 2023-11-22 ENCOUNTER — Other Ambulatory Visit (HOSPITAL_COMMUNITY)
Admission: RE | Admit: 2023-11-22 | Discharge: 2023-11-22 | Disposition: A | Discharge status: Home / Self Care | Source: Ambulatory Visit | Attending: Obstetrics and Gynecology | Admitting: Obstetrics and Gynecology

## 2023-11-22 ENCOUNTER — Ambulatory Visit

## 2023-11-22 VITALS — BP 106/70 | HR 94

## 2023-11-22 DIAGNOSIS — Z113 Encounter for screening for infections with a predominantly sexual mode of transmission: Secondary | ICD-10-CM

## 2023-11-22 NOTE — Progress Notes (Signed)
 SUBJECTIVE:  18 y.o. female who desires a STI screen for TOC.  Denies abnormal vaginal discharge, bleeding or significant pelvic pain. No UTI symptoms. Denies history of known exposure to STD.  LMP ~11/08/23  OBJECTIVE:  She appears well.   ASSESSMENT:  STI Screen   PLAN:  Pt offered STI blood screening-declined GC, chlamydia, and trichomonas probe sent to lab.  Treatment: To be determined once lab results are received.  Pt follow up as needed.

## 2023-11-23 LAB — CERVICOVAGINAL ANCILLARY ONLY
Bacterial Vaginitis (gardnerella): POSITIVE — AB
Candida Glabrata: NEGATIVE
Candida Vaginitis: NEGATIVE
Chlamydia: NEGATIVE
Comment: NEGATIVE
Comment: NEGATIVE
Comment: NEGATIVE
Comment: NEGATIVE
Comment: NEGATIVE
Comment: NORMAL
Neisseria Gonorrhea: NEGATIVE
Trichomonas: NEGATIVE

## 2023-11-27 ENCOUNTER — Ambulatory Visit: Payer: Self-pay

## 2023-11-27 ENCOUNTER — Ambulatory Visit

## 2023-11-27 ENCOUNTER — Other Ambulatory Visit: Payer: Self-pay | Admitting: Lactation Services

## 2023-11-27 MED ORDER — METRONIDAZOLE 500 MG PO TABS: 500.0000 mg | ORAL_TABLET | Freq: Two times a day (BID) | ORAL | 0 refills | Status: DC

## 2024-01-10 ENCOUNTER — Ambulatory Visit (INDEPENDENT_AMBULATORY_CARE_PROVIDER_SITE_OTHER)

## 2024-01-10 ENCOUNTER — Other Ambulatory Visit (HOSPITAL_COMMUNITY)
Admission: RE | Admit: 2024-01-10 | Discharge: 2024-01-10 | Disposition: A | Discharge status: Home / Self Care | Source: Ambulatory Visit | Attending: Obstetrics and Gynecology | Admitting: Obstetrics and Gynecology

## 2024-01-10 VITALS — BP 134/82 | HR 104 | Wt 99.8 lb

## 2024-01-10 DIAGNOSIS — Z113 Encounter for screening for infections with a predominantly sexual mode of transmission: Secondary | ICD-10-CM

## 2024-01-10 NOTE — Progress Notes (Signed)
..  SUBJECTIVE:  18 y.o. female is in the office for STD testing. Denies abnormal vaginal bleeding or significant pelvic pain or fever. No UTI symptoms. Denies history of known exposure to STD.  No LMP recorded.  OBJECTIVE:  She appears well, afebrile. Urine dipstick: not done.  ASSESSMENT:  STD Testing  PLAN:  GC, chlamydia, trichomonas, BVAG, CVAG probe sent to lab. Treatment: To be determined once lab results are received ROV prn if symptoms persist or worsen.

## 2024-01-14 ENCOUNTER — Other Ambulatory Visit: Payer: Self-pay

## 2024-01-14 ENCOUNTER — Ambulatory Visit: Payer: Self-pay | Admitting: Obstetrics and Gynecology

## 2024-01-14 LAB — CERVICOVAGINAL ANCILLARY ONLY
Bacterial Vaginitis (gardnerella): POSITIVE — AB
Candida Glabrata: NEGATIVE
Candida Vaginitis: POSITIVE — AB
Chlamydia: NEGATIVE
Comment: NEGATIVE
Comment: NEGATIVE
Comment: NEGATIVE
Comment: NEGATIVE
Comment: NEGATIVE
Comment: NORMAL
Neisseria Gonorrhea: NEGATIVE
Trichomonas: NEGATIVE

## 2024-01-14 MED ORDER — FLUCONAZOLE 150 MG PO TABS: 150.0000 mg | ORAL_TABLET | Freq: Once | ORAL | 0 refills | Status: AC

## 2024-01-14 MED ORDER — METRONIDAZOLE 500 MG PO TABS: 500.0000 mg | ORAL_TABLET | Freq: Two times a day (BID) | ORAL | 0 refills | Status: AC

## 2024-01-17 ENCOUNTER — Other Ambulatory Visit (HOSPITAL_BASED_OUTPATIENT_CLINIC_OR_DEPARTMENT_OTHER): Payer: Self-pay | Admitting: Family Medicine

## 2024-01-21 ENCOUNTER — Encounter (HOSPITAL_BASED_OUTPATIENT_CLINIC_OR_DEPARTMENT_OTHER): Admitting: Family Medicine

## 2024-01-22 ENCOUNTER — Ambulatory Visit (HOSPITAL_BASED_OUTPATIENT_CLINIC_OR_DEPARTMENT_OTHER): Payer: Medicaid Other

## 2024-04-15 ENCOUNTER — Encounter (HOSPITAL_BASED_OUTPATIENT_CLINIC_OR_DEPARTMENT_OTHER): Admitting: Family Medicine

## 2024-07-28 ENCOUNTER — Encounter (HOSPITAL_BASED_OUTPATIENT_CLINIC_OR_DEPARTMENT_OTHER): Payer: Medicaid Other | Admitting: Family Medicine

## 2024-08-05 ENCOUNTER — Encounter (HOSPITAL_BASED_OUTPATIENT_CLINIC_OR_DEPARTMENT_OTHER): Admitting: Family Medicine
